# Patient Record
Sex: Female | Born: 1955 | Race: White | Smoking: Never smoker
Health system: Southern US, Community
[De-identification: ages and names within clinical notes are randomized; demographics above are authoritative.]

## PROBLEM LIST (undated history)

## (undated) DIAGNOSIS — E079 Disorder of thyroid, unspecified: Secondary | ICD-10-CM

## (undated) DIAGNOSIS — E785 Hyperlipidemia, unspecified: Secondary | ICD-10-CM

## (undated) DIAGNOSIS — F32A Depression, unspecified: Secondary | ICD-10-CM

## (undated) HISTORY — DX: Hyperlipidemia, unspecified: E78.5

## (undated) HISTORY — DX: Depression, unspecified: F32.A

## (undated) HISTORY — PX: AUGMENTATION MAMMAPLASTY: SUR837

## (undated) HISTORY — DX: Disorder of thyroid, unspecified: E07.9

---

## 2017-10-13 DIAGNOSIS — F419 Anxiety disorder, unspecified: Secondary | ICD-10-CM | POA: Insufficient documentation

## 2017-10-13 DIAGNOSIS — Z79899 Other long term (current) drug therapy: Secondary | ICD-10-CM | POA: Insufficient documentation

## 2017-10-13 DIAGNOSIS — F200 Paranoid schizophrenia: Secondary | ICD-10-CM | POA: Insufficient documentation

## 2017-11-30 DIAGNOSIS — L819 Disorder of pigmentation, unspecified: Secondary | ICD-10-CM | POA: Insufficient documentation

## 2017-11-30 DIAGNOSIS — E039 Hypothyroidism, unspecified: Secondary | ICD-10-CM | POA: Insufficient documentation

## 2018-02-22 DIAGNOSIS — R0982 Postnasal drip: Secondary | ICD-10-CM | POA: Insufficient documentation

## 2018-08-02 DIAGNOSIS — K117 Disturbances of salivary secretion: Secondary | ICD-10-CM | POA: Insufficient documentation

## 2018-09-14 DIAGNOSIS — F22 Delusional disorders: Secondary | ICD-10-CM | POA: Insufficient documentation

## 2021-08-05 DIAGNOSIS — Z79899 Other long term (current) drug therapy: Secondary | ICD-10-CM | POA: Diagnosis not present

## 2021-08-30 DIAGNOSIS — M25562 Pain in left knee: Secondary | ICD-10-CM | POA: Diagnosis not present

## 2021-09-07 DIAGNOSIS — Z79899 Other long term (current) drug therapy: Secondary | ICD-10-CM | POA: Diagnosis not present

## 2021-10-05 DIAGNOSIS — Z79899 Other long term (current) drug therapy: Secondary | ICD-10-CM | POA: Diagnosis not present

## 2021-11-04 DIAGNOSIS — Z79899 Other long term (current) drug therapy: Secondary | ICD-10-CM | POA: Diagnosis not present

## 2021-11-30 DIAGNOSIS — E7800 Pure hypercholesterolemia, unspecified: Secondary | ICD-10-CM | POA: Diagnosis not present

## 2021-11-30 DIAGNOSIS — E039 Hypothyroidism, unspecified: Secondary | ICD-10-CM | POA: Diagnosis not present

## 2021-11-30 DIAGNOSIS — Z79899 Other long term (current) drug therapy: Secondary | ICD-10-CM | POA: Diagnosis not present

## 2021-12-03 DIAGNOSIS — E039 Hypothyroidism, unspecified: Secondary | ICD-10-CM | POA: Diagnosis not present

## 2021-12-03 DIAGNOSIS — E7800 Pure hypercholesterolemia, unspecified: Secondary | ICD-10-CM | POA: Diagnosis not present

## 2021-12-03 DIAGNOSIS — Z79899 Other long term (current) drug therapy: Secondary | ICD-10-CM | POA: Diagnosis not present

## 2021-12-03 DIAGNOSIS — R109 Unspecified abdominal pain: Secondary | ICD-10-CM | POA: Diagnosis not present

## 2021-12-03 DIAGNOSIS — Z23 Encounter for immunization: Secondary | ICD-10-CM | POA: Diagnosis not present

## 2021-12-03 DIAGNOSIS — I700 Atherosclerosis of aorta: Secondary | ICD-10-CM | POA: Diagnosis not present

## 2021-12-03 DIAGNOSIS — D329 Benign neoplasm of meninges, unspecified: Secondary | ICD-10-CM | POA: Diagnosis not present

## 2021-12-03 DIAGNOSIS — Z0001 Encounter for general adult medical examination with abnormal findings: Secondary | ICD-10-CM | POA: Diagnosis not present

## 2021-12-07 ENCOUNTER — Ambulatory Visit
Admission: RE | Admit: 2021-12-07 | Discharge: 2021-12-07 | Disposition: A | Discharge status: Home / Self Care | Payer: Medicare Other | Source: Ambulatory Visit | Attending: Family Medicine | Admitting: Family Medicine

## 2021-12-07 ENCOUNTER — Other Ambulatory Visit: Payer: Self-pay | Admitting: Family Medicine

## 2021-12-07 ENCOUNTER — Other Ambulatory Visit: Payer: Self-pay

## 2021-12-07 DIAGNOSIS — M419 Scoliosis, unspecified: Secondary | ICD-10-CM | POA: Diagnosis not present

## 2021-12-07 DIAGNOSIS — R109 Unspecified abdominal pain: Secondary | ICD-10-CM

## 2021-12-07 DIAGNOSIS — Z9882 Breast implant status: Secondary | ICD-10-CM | POA: Diagnosis not present

## 2021-12-08 ENCOUNTER — Other Ambulatory Visit: Payer: Self-pay | Admitting: Family Medicine

## 2021-12-08 DIAGNOSIS — D329 Benign neoplasm of meninges, unspecified: Secondary | ICD-10-CM

## 2021-12-09 DIAGNOSIS — Z79899 Other long term (current) drug therapy: Secondary | ICD-10-CM | POA: Diagnosis not present

## 2021-12-17 ENCOUNTER — Other Ambulatory Visit: Payer: Self-pay | Admitting: Family Medicine

## 2021-12-17 DIAGNOSIS — K5900 Constipation, unspecified: Secondary | ICD-10-CM | POA: Diagnosis not present

## 2021-12-17 DIAGNOSIS — Z1231 Encounter for screening mammogram for malignant neoplasm of breast: Secondary | ICD-10-CM

## 2021-12-17 DIAGNOSIS — E2839 Other primary ovarian failure: Secondary | ICD-10-CM

## 2021-12-17 DIAGNOSIS — Z1211 Encounter for screening for malignant neoplasm of colon: Secondary | ICD-10-CM | POA: Diagnosis not present

## 2021-12-30 ENCOUNTER — Other Ambulatory Visit: Payer: Self-pay

## 2021-12-30 ENCOUNTER — Ambulatory Visit
Admission: RE | Admit: 2021-12-30 | Discharge: 2021-12-30 | Disposition: A | Discharge status: Home / Self Care | Payer: Medicare Other | Source: Ambulatory Visit | Attending: Family Medicine | Admitting: Family Medicine

## 2021-12-30 DIAGNOSIS — R42 Dizziness and giddiness: Secondary | ICD-10-CM | POA: Diagnosis not present

## 2021-12-30 DIAGNOSIS — D329 Benign neoplasm of meninges, unspecified: Secondary | ICD-10-CM

## 2022-01-04 DIAGNOSIS — Z79899 Other long term (current) drug therapy: Secondary | ICD-10-CM | POA: Diagnosis not present

## 2022-01-12 ENCOUNTER — Ambulatory Visit: Payer: Medicare Other

## 2022-02-01 ENCOUNTER — Other Ambulatory Visit: Payer: Self-pay | Admitting: Family Medicine

## 2022-02-01 ENCOUNTER — Ambulatory Visit
Admission: RE | Admit: 2022-02-01 | Discharge: 2022-02-01 | Disposition: A | Discharge status: Home / Self Care | Payer: Medicare Other | Source: Ambulatory Visit | Attending: Family Medicine | Admitting: Family Medicine

## 2022-02-01 ENCOUNTER — Other Ambulatory Visit: Payer: Self-pay

## 2022-02-01 DIAGNOSIS — Z1231 Encounter for screening mammogram for malignant neoplasm of breast: Secondary | ICD-10-CM | POA: Diagnosis not present

## 2022-02-03 ENCOUNTER — Other Ambulatory Visit: Payer: Self-pay | Admitting: Family Medicine

## 2022-02-03 DIAGNOSIS — Z79899 Other long term (current) drug therapy: Secondary | ICD-10-CM | POA: Diagnosis not present

## 2022-02-03 DIAGNOSIS — R928 Other abnormal and inconclusive findings on diagnostic imaging of breast: Secondary | ICD-10-CM

## 2022-02-15 ENCOUNTER — Other Ambulatory Visit: Payer: Medicare Other

## 2022-02-15 ENCOUNTER — Other Ambulatory Visit: Payer: Self-pay | Admitting: Family Medicine

## 2022-02-15 DIAGNOSIS — R928 Other abnormal and inconclusive findings on diagnostic imaging of breast: Secondary | ICD-10-CM

## 2022-03-08 ENCOUNTER — Ambulatory Visit
Admission: RE | Admit: 2022-03-08 | Discharge: 2022-03-08 | Disposition: A | Discharge status: Home / Self Care | Payer: Medicare Other | Source: Ambulatory Visit | Attending: Family Medicine | Admitting: Family Medicine

## 2022-03-08 ENCOUNTER — Other Ambulatory Visit: Payer: Self-pay | Admitting: Family Medicine

## 2022-03-08 ENCOUNTER — Ambulatory Visit: Payer: Medicare Other

## 2022-03-08 ENCOUNTER — Other Ambulatory Visit: Payer: Medicare Other

## 2022-03-08 DIAGNOSIS — R928 Other abnormal and inconclusive findings on diagnostic imaging of breast: Secondary | ICD-10-CM

## 2022-03-08 DIAGNOSIS — Z79899 Other long term (current) drug therapy: Secondary | ICD-10-CM | POA: Diagnosis not present

## 2022-03-08 DIAGNOSIS — N6489 Other specified disorders of breast: Secondary | ICD-10-CM

## 2022-03-08 DIAGNOSIS — R922 Inconclusive mammogram: Secondary | ICD-10-CM | POA: Diagnosis not present

## 2022-03-21 DIAGNOSIS — J029 Acute pharyngitis, unspecified: Secondary | ICD-10-CM | POA: Diagnosis not present

## 2022-03-21 DIAGNOSIS — B349 Viral infection, unspecified: Secondary | ICD-10-CM | POA: Diagnosis not present

## 2022-03-21 DIAGNOSIS — J40 Bronchitis, not specified as acute or chronic: Secondary | ICD-10-CM | POA: Diagnosis not present

## 2022-03-21 DIAGNOSIS — R059 Cough, unspecified: Secondary | ICD-10-CM | POA: Diagnosis not present

## 2022-03-21 DIAGNOSIS — U071 COVID-19: Secondary | ICD-10-CM | POA: Diagnosis not present

## 2022-03-21 DIAGNOSIS — R5383 Other fatigue: Secondary | ICD-10-CM | POA: Diagnosis not present

## 2022-03-26 DIAGNOSIS — Z20822 Contact with and (suspected) exposure to covid-19: Secondary | ICD-10-CM | POA: Diagnosis not present

## 2022-04-05 DIAGNOSIS — Z79899 Other long term (current) drug therapy: Secondary | ICD-10-CM | POA: Diagnosis not present

## 2022-05-05 ENCOUNTER — Ambulatory Visit
Admission: RE | Admit: 2022-05-05 | Discharge: 2022-05-05 | Disposition: A | Discharge status: Home / Self Care | Payer: Medicare Other | Source: Ambulatory Visit | Attending: Family Medicine | Admitting: Family Medicine

## 2022-05-05 DIAGNOSIS — M85852 Other specified disorders of bone density and structure, left thigh: Secondary | ICD-10-CM | POA: Diagnosis not present

## 2022-05-05 DIAGNOSIS — Z780 Asymptomatic menopausal state: Secondary | ICD-10-CM | POA: Diagnosis not present

## 2022-05-05 DIAGNOSIS — E2839 Other primary ovarian failure: Secondary | ICD-10-CM

## 2022-05-09 DIAGNOSIS — Z79899 Other long term (current) drug therapy: Secondary | ICD-10-CM | POA: Diagnosis not present

## 2022-05-31 DIAGNOSIS — Z79899 Other long term (current) drug therapy: Secondary | ICD-10-CM | POA: Diagnosis not present

## 2022-05-31 DIAGNOSIS — D329 Benign neoplasm of meninges, unspecified: Secondary | ICD-10-CM | POA: Diagnosis not present

## 2022-05-31 DIAGNOSIS — E7800 Pure hypercholesterolemia, unspecified: Secondary | ICD-10-CM | POA: Diagnosis not present

## 2022-05-31 DIAGNOSIS — I700 Atherosclerosis of aorta: Secondary | ICD-10-CM | POA: Diagnosis not present

## 2022-05-31 DIAGNOSIS — K5900 Constipation, unspecified: Secondary | ICD-10-CM | POA: Diagnosis not present

## 2022-05-31 DIAGNOSIS — E039 Hypothyroidism, unspecified: Secondary | ICD-10-CM | POA: Diagnosis not present

## 2022-06-14 DIAGNOSIS — Z79899 Other long term (current) drug therapy: Secondary | ICD-10-CM | POA: Diagnosis not present

## 2022-07-13 DIAGNOSIS — Z79899 Other long term (current) drug therapy: Secondary | ICD-10-CM | POA: Diagnosis not present

## 2022-07-26 ENCOUNTER — Ambulatory Visit (INDEPENDENT_AMBULATORY_CARE_PROVIDER_SITE_OTHER): Payer: Medicare Other | Admitting: Podiatry

## 2022-07-26 ENCOUNTER — Ambulatory Visit: Payer: Medicare Other

## 2022-07-26 DIAGNOSIS — B351 Tinea unguium: Secondary | ICD-10-CM

## 2022-07-26 DIAGNOSIS — M79675 Pain in left toe(s): Secondary | ICD-10-CM

## 2022-07-26 DIAGNOSIS — M79674 Pain in right toe(s): Secondary | ICD-10-CM

## 2022-07-26 DIAGNOSIS — S99922A Unspecified injury of left foot, initial encounter: Secondary | ICD-10-CM

## 2022-07-27 ENCOUNTER — Encounter: Payer: Self-pay | Admitting: Podiatry

## 2022-07-27 NOTE — Progress Notes (Signed)
  Subjective:  Patient ID: Katelyn Ryan, female    DOB: June 23, 1956,  MRN: [id number]  Chief Complaint  Patient presents with   Nail Problem    Left first and second toenails are thick     66 y.o. female presents with the above complaint. History confirmed with patient.   Objective:  Physical Exam: warm, good capillary refill, no trophic changes or ulcerative lesions, normal DP and PT pulses, normal sensory exam, and thickened dystrophic brown discolored nails, the worst of which over the left hallux and second toenail.  Assessment:   1. Pain due to onychomycosis of toenails of both feet      Plan:  Patient was evaluated and treated and all questions answered.  Discussed the etiology and treatment options for the condition in detail with the patient. Educated patient on the topical and oral treatment options for mycotic nails. Recommended debridement of the nails today. Sharp and mechanical debridement performed of all painful and mycotic nails today. Nails debrided in length and thickness using a nail nipper to level of comfort. Discussed treatment options including appropriate shoe gear. Follow up as needed for painful nails.   Return in about 3 months (around 10/26/2022) for RFC .

## 2022-08-04 DIAGNOSIS — Z79899 Other long term (current) drug therapy: Secondary | ICD-10-CM | POA: Diagnosis not present

## 2022-08-19 DIAGNOSIS — Z1283 Encounter for screening for malignant neoplasm of skin: Secondary | ICD-10-CM | POA: Diagnosis not present

## 2022-08-19 DIAGNOSIS — D225 Melanocytic nevi of trunk: Secondary | ICD-10-CM | POA: Diagnosis not present

## 2022-08-19 DIAGNOSIS — D485 Neoplasm of uncertain behavior of skin: Secondary | ICD-10-CM | POA: Diagnosis not present

## 2022-09-06 DIAGNOSIS — Z79899 Other long term (current) drug therapy: Secondary | ICD-10-CM | POA: Diagnosis not present

## 2022-09-08 ENCOUNTER — Ambulatory Visit
Admission: RE | Admit: 2022-09-08 | Discharge: 2022-09-08 | Disposition: A | Discharge status: Home / Self Care | Payer: Medicare Other | Source: Ambulatory Visit | Attending: Family Medicine | Admitting: Family Medicine

## 2022-09-08 DIAGNOSIS — R922 Inconclusive mammogram: Secondary | ICD-10-CM | POA: Diagnosis not present

## 2022-09-08 DIAGNOSIS — N6489 Other specified disorders of breast: Secondary | ICD-10-CM

## 2022-10-10 DIAGNOSIS — Z79899 Other long term (current) drug therapy: Secondary | ICD-10-CM | POA: Diagnosis not present

## 2022-10-26 ENCOUNTER — Encounter: Payer: Self-pay | Admitting: Podiatry

## 2022-10-26 ENCOUNTER — Ambulatory Visit (INDEPENDENT_AMBULATORY_CARE_PROVIDER_SITE_OTHER): Payer: Medicare Other | Admitting: Podiatry

## 2022-10-26 DIAGNOSIS — M79675 Pain in left toe(s): Secondary | ICD-10-CM

## 2022-10-26 DIAGNOSIS — B351 Tinea unguium: Secondary | ICD-10-CM

## 2022-10-26 DIAGNOSIS — M79674 Pain in right toe(s): Secondary | ICD-10-CM

## 2022-10-26 NOTE — Progress Notes (Signed)
This patient presents to the office with chief complaint of long thick painful nails.  Patient says the nails are painful walking and wearing shoes.  This patient is unable to self treat.  This patient is unable to trim her nails since she is unable to reach her nails.  She presents to the office for preventative foot care services.  General Appearance  Alert, conversant and in no acute stress.  Vascular  Dorsalis pedis and posterior tibial  pulses are palpable  bilaterally.  Capillary return is within normal limits  bilaterally. Temperature is within normal limits  bilaterally.  Neurologic  Senn-Weinstein monofilament wire test within normal limits  bilaterally. Muscle power within normal limits bilaterally.  Nails Thick disfigured discolored nails with subungual debris  from hallux to fifth toes bilaterally. No evidence of bacterial infection or drainage bilaterally.  Orthopedic  No limitations of motion  feet .  No crepitus or effusions noted.  No bony pathology or digital deformities noted.  Skin  normotropic skin with no porokeratosis noted bilaterally.  No signs of infections or ulcers noted.     Onychomycosis  Nails  B/L.  Pain in right toes  Pain in left toes  Debridement of nails both feet followed trimming the nails with dremel tool.  Patient is wearing nail polish so minimal dremel tool usage.  RTC 3 months.   Gardiner Barefoot DPM

## 2022-11-07 DIAGNOSIS — Z79899 Other long term (current) drug therapy: Secondary | ICD-10-CM | POA: Diagnosis not present

## 2022-11-24 DIAGNOSIS — Z79899 Other long term (current) drug therapy: Secondary | ICD-10-CM | POA: Diagnosis not present

## 2022-11-24 DIAGNOSIS — E7800 Pure hypercholesterolemia, unspecified: Secondary | ICD-10-CM | POA: Diagnosis not present

## 2022-11-24 DIAGNOSIS — E039 Hypothyroidism, unspecified: Secondary | ICD-10-CM | POA: Diagnosis not present

## 2022-11-24 DIAGNOSIS — Z0001 Encounter for general adult medical examination with abnormal findings: Secondary | ICD-10-CM | POA: Diagnosis not present

## 2022-11-24 DIAGNOSIS — I700 Atherosclerosis of aorta: Secondary | ICD-10-CM | POA: Diagnosis not present

## 2022-12-08 DIAGNOSIS — Z79899 Other long term (current) drug therapy: Secondary | ICD-10-CM | POA: Diagnosis not present

## 2022-12-19 ENCOUNTER — Emergency Department (HOSPITAL_COMMUNITY): Payer: Medicare Other

## 2022-12-19 ENCOUNTER — Other Ambulatory Visit: Payer: Self-pay

## 2022-12-19 ENCOUNTER — Emergency Department (HOSPITAL_COMMUNITY)
Admission: EM | Admit: 2022-12-19 | Discharge: 2022-12-19 | Disposition: A | Discharge status: Home / Self Care | Payer: Medicare Other | Attending: Emergency Medicine | Admitting: Emergency Medicine

## 2022-12-19 ENCOUNTER — Encounter (HOSPITAL_COMMUNITY): Payer: Self-pay

## 2022-12-19 DIAGNOSIS — R531 Weakness: Secondary | ICD-10-CM | POA: Diagnosis not present

## 2022-12-19 DIAGNOSIS — M6281 Muscle weakness (generalized): Secondary | ICD-10-CM | POA: Insufficient documentation

## 2022-12-19 DIAGNOSIS — R059 Cough, unspecified: Secondary | ICD-10-CM | POA: Diagnosis not present

## 2022-12-19 DIAGNOSIS — I517 Cardiomegaly: Secondary | ICD-10-CM | POA: Insufficient documentation

## 2022-12-19 DIAGNOSIS — R000 Tachycardia, unspecified: Secondary | ICD-10-CM | POA: Diagnosis not present

## 2022-12-19 DIAGNOSIS — W19XXXA Unspecified fall, initial encounter: Secondary | ICD-10-CM | POA: Diagnosis not present

## 2022-12-19 DIAGNOSIS — Z1152 Encounter for screening for COVID-19: Secondary | ICD-10-CM | POA: Insufficient documentation

## 2022-12-19 DIAGNOSIS — D329 Benign neoplasm of meninges, unspecified: Secondary | ICD-10-CM | POA: Diagnosis not present

## 2022-12-19 DIAGNOSIS — Z743 Need for continuous supervision: Secondary | ICD-10-CM | POA: Diagnosis not present

## 2022-12-19 DIAGNOSIS — R404 Transient alteration of awareness: Secondary | ICD-10-CM | POA: Diagnosis not present

## 2022-12-19 DIAGNOSIS — R0989 Other specified symptoms and signs involving the circulatory and respiratory systems: Secondary | ICD-10-CM | POA: Diagnosis not present

## 2022-12-19 DIAGNOSIS — R55 Syncope and collapse: Secondary | ICD-10-CM | POA: Diagnosis not present

## 2022-12-19 LAB — RESP PANEL BY RT-PCR (RSV, FLU A&B, COVID)  RVPGX2
Influenza A by PCR: NEGATIVE
Influenza B by PCR: NEGATIVE
Resp Syncytial Virus by PCR: NEGATIVE
SARS Coronavirus 2 by RT PCR: NEGATIVE

## 2022-12-19 LAB — CBC WITH DIFFERENTIAL/PLATELET
Abs Immature Granulocytes: 0.03 10*3/uL (ref 0.00–0.07)
Basophils Absolute: 0 10*3/uL (ref 0.0–0.1)
Basophils Relative: 0 %
Eosinophils Absolute: 0 10*3/uL (ref 0.0–0.5)
Eosinophils Relative: 0 %
HCT: 41.7 % (ref 36.0–46.0)
Hemoglobin: 13.2 g/dL (ref 12.0–15.0)
Immature Granulocytes: 0 %
Lymphocytes Relative: 8 %
Lymphs Abs: 0.7 10*3/uL (ref 0.7–4.0)
MCH: 30.1 pg (ref 26.0–34.0)
MCHC: 31.7 g/dL (ref 30.0–36.0)
MCV: 95 fL (ref 80.0–100.0)
Monocytes Absolute: 0.9 10*3/uL (ref 0.1–1.0)
Monocytes Relative: 10 %
Neutro Abs: 7 10*3/uL (ref 1.7–7.7)
Neutrophils Relative %: 82 %
Platelets: 187 10*3/uL (ref 150–400)
RBC: 4.39 MIL/uL (ref 3.87–5.11)
RDW: 13.4 % (ref 11.5–15.5)
WBC: 8.6 10*3/uL (ref 4.0–10.5)
nRBC: 0 % (ref 0.0–0.2)

## 2022-12-19 LAB — COMPREHENSIVE METABOLIC PANEL
ALT: 20 U/L (ref 0–44)
AST: 28 U/L (ref 15–41)
Albumin: 4 g/dL (ref 3.5–5.0)
Alkaline Phosphatase: 79 U/L (ref 38–126)
Anion gap: 11 (ref 5–15)
BUN: 12 mg/dL (ref 8–23)
CO2: 28 mmol/L (ref 22–32)
Calcium: 9.1 mg/dL (ref 8.9–10.3)
Chloride: 101 mmol/L (ref 98–111)
Creatinine, Ser: 0.8 mg/dL (ref 0.44–1.00)
GFR, Estimated: >60 mL/min (ref >60)
Glucose, Bld: 99 mg/dL (ref 70–99)
Potassium: 3.7 mmol/L (ref 3.5–5.1)
Sodium: 140 mmol/L (ref 135–145)
Total Bilirubin: 0.4 mg/dL (ref 0.3–1.2)
Total Protein: 7 g/dL (ref 6.5–8.1)

## 2022-12-19 LAB — COOXEMETRY PANEL
Carboxyhemoglobin: 0.8 % (ref 0.5–1.5)
Methemoglobin: <0.7 % (ref 0.0–1.5)
O2 Saturation: 61.7 %
Total hemoglobin: 13.4 g/dL (ref 12.0–16.0)

## 2022-12-19 LAB — AMMONIA: Ammonia: 11 umol/L (ref 9–35)

## 2022-12-19 MED ORDER — ACETAMINOPHEN 325 MG PO TABS
650.0000 mg | ORAL_TABLET | Freq: Once | ORAL | Status: AC
  Administered 2022-12-19: 650 mg via ORAL
  Filled 2022-12-19: qty 2

## 2022-12-19 NOTE — ED Triage Notes (Addendum)
Patient BIB [facility] from the [facility] ([place]) for generalized weakness and bilateral leg shakiness. Patient said at night her legs cramp.

## 2022-12-19 NOTE — ED Notes (Signed)
Pt's visitor to nurses station asking if we will be giving the pt her regular 20:00 medications. Advised for her to speak with EDP when they come in to see pt.

## 2022-12-19 NOTE — ED Notes (Signed)
Pt attempted to provide urine sample unsuccessfully. Pt is ambulatory to restroom w/out assistance. Will attempt urine collection again at a later time.

## 2022-12-19 NOTE — ED Provider Triage Note (Signed)
Emergency Medicine Provider Triage Evaluation Note  Katelyn Ryan , a 67 y.o. female  was evaluated in triage.  Pt complains of weaknessx2 days. From group home, staff states she was off balance today and requiring 2+ assist. Normal independent ADLs. No chest pain or SOB  Review of Systems  Positive: weakness Negative: SOB  Physical Exam  BP 131/78 (BP Location: Left Arm)   Pulse (!) 115   Temp 100 F (37.8 C) (Oral)   Resp 17   Ht '5\' 4"'$  (1.626 m)   Wt 64 kg   SpO2 96%   BMI 24.20 kg/m  Gen:   Awake, no distress   Resp:  Normal effort  MSK:   Moves extremities without difficulty    Medical Decision Making  Medically screening exam initiated at 12:17 PM.  Appropriate orders placed.  Katelyn Ryan was informed that the remainder of the evaluation will be completed by another provider, this initial triage assessment does not replace that evaluation, and the importance of remaining in the ED until their evaluation is complete.    Osvaldo Shipper, [place] 12/19/22 1218

## 2022-12-19 NOTE — ED Provider Notes (Signed)
[facility] DEPT Provider Note   CSN: [id number] Arrival date & time: 12/19/22  1128     History  Chief Complaint  Patient presents with   Weakness    Katelyn Ryan is a 67 y.o. female.  67 year old female presents from group home with weakness x 2 days.  Patient has had trouble ambulating as of late.  Has had cough congestion.  Denies any vomiting or diarrhea.  Some scratchy throat.  Denies any urinary symptoms.  No abdominal or chest discomfort.       Home Medications Prior to Admission medications   Not on File      Allergies    Patient has no known allergies.    Review of Systems   Review of Systems  All other systems reviewed and are negative.   Physical Exam Updated Vital Signs BP 131/78 (BP Location: Left Arm)   Pulse (!) 115   Temp 100 F (37.8 C) (Oral)   Resp 17   Ht 1.626 m ('5\' 4"'$ )   Wt 64 kg   SpO2 96%   BMI 24.20 kg/m  Physical Exam Vitals and nursing note reviewed.  Constitutional:      General: She is not in acute distress.    Appearance: Normal appearance. She is well-developed. She is not toxic-appearing.  HENT:     Head: Normocephalic and atraumatic.  Eyes:     General: Lids are normal.     Conjunctiva/sclera: Conjunctivae normal.     Pupils: Pupils are equal, round, and reactive to light.  Neck:     Thyroid: No thyroid mass.     Trachea: No tracheal deviation.  Cardiovascular:     Rate and Rhythm: Normal rate and regular rhythm.     Heart sounds: Normal heart sounds. No murmur heard.    No gallop.  Pulmonary:     Effort: Pulmonary effort is normal. No respiratory distress.     Breath sounds: Normal breath sounds. No stridor. No decreased breath sounds, wheezing, rhonchi or rales.  Abdominal:     General: There is no distension.     Palpations: Abdomen is soft.     Tenderness: There is no abdominal tenderness. There is no rebound.  Musculoskeletal:        General: No tenderness. Normal range of  motion.     Cervical back: Normal range of motion and neck supple.     Right knee: Normal range of motion. No tenderness.     Comments: Patient able to ambulate without difficulty.  Skin:    General: Skin is warm and dry.     Findings: No abrasion or rash.  Neurological:     Mental Status: She is alert and oriented to person, place, and time. Mental status is at baseline.     GCS: GCS eye subscore is 4. GCS verbal subscore is 5. GCS motor subscore is 6.     Cranial Nerves: No cranial nerve deficit.     Sensory: No sensory deficit.     Motor: Motor function is intact.  Psychiatric:        Attention and Perception: Attention normal.        Speech: Speech normal.        Behavior: Behavior normal.     ED Results / Procedures / Treatments   Labs (all labs ordered are listed, but only abnormal results are displayed) Labs Reviewed  RESP PANEL BY RT-PCR (RSV, FLU A&B, COVID)  [id number]  COMPREHENSIVE METABOLIC PANEL  CBC WITH DIFFERENTIAL/PLATELET  AMMONIA  COOXEMETRY PANEL  URINALYSIS, ROUTINE W REFLEX MICROSCOPIC    EKG EKG Interpretation  Date/Time:  Monday December 19 2022 12:52:05 EST Ventricular Rate:  110 PR Interval:  140 QRS Duration: 88 QT Interval:  314 QTC Calculation: 425 R Axis:   23 Text Interpretation: Sinus tachycardia Left atrial enlargement Low voltage, precordial leads RSR' in V1 or V2, probably normal variant Borderline T wave abnormalities Confirmed by Lacretia Leigh ([id number]) on 12/19/2022 7:16:26 PM  Radiology CT Head Wo Contrast  Result Date: 12/19/2022 CLINICAL DATA:  Syncope/presyncope, cerebrovascular cause suspected EXAM: CT HEAD WITHOUT CONTRAST TECHNIQUE: Contiguous axial images were obtained from the base of the skull through the vertex without intravenous contrast. RADIATION DOSE REDUCTION: This exam was performed according to the departmental dose-optimization program which includes automated exposure control, adjustment of the mA and/or kV according  to patient size and/or use of iterative reconstruction technique. COMPARISON:  MRI 12/30/2021. FINDINGS: Brain: No evidence of acute infarction, hemorrhage, hydrocephalus, or extra-axial collection. 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 Vascular: No hyperdense vessel. Skull: Normal. Negative for fracture or focal lesion. Sinuses/Orbits: No acute finding. Other: No mastoid effusions IMPRESSION: 1. No evidence of acute intracranial abnormality. 2. 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 Electronically Signed   By: Margaretha Sheffield M.D.   On: 12/19/2022 13:43    Procedures Procedures    Medications Ordered in ED Medications  acetaminophen (TYLENOL) tablet 650 mg (has no administration in time range)    ED Course/ Medical Decision Making/ A&P                             Medical Decision Making Amount and/or Complexity of Data Reviewed Radiology: ordered.  Risk OTC drugs.   The patient presented with diffuse weakness x 2 days.  Was noted to have low-grade temperature here.  COVID and flu test were negative.  Does not appear to be septic.  Has history of angioma of the brain and head CT today but was performed to rule out serious intracranial pathology.  Per my review interpretation no acute findings noted.  No leukocytosis noted on CBC.  Chest x-ray per interpretation showed no acute findings.  Suspect viral illness.  Will discharge back to facility.  She is here with her power of attorney who agrees with this        Final Clinical Impression(s) / ED Diagnoses Final diagnoses:  None    Rx / DC Orders ED Discharge Orders     None         Lacretia Leigh, MD 12/19/22 2040

## 2023-01-03 IMAGING — CR DG ABDOMEN 2V
2 series · 2 of 2 positions shown · non-contrast
Comparison: Abdominal x-ray 12/11/2018.

CLINICAL DATA: Abdominal discomfort, question constipation.

EXAM:
ABDOMEN - 2 VIEW

[w abdomen upright]
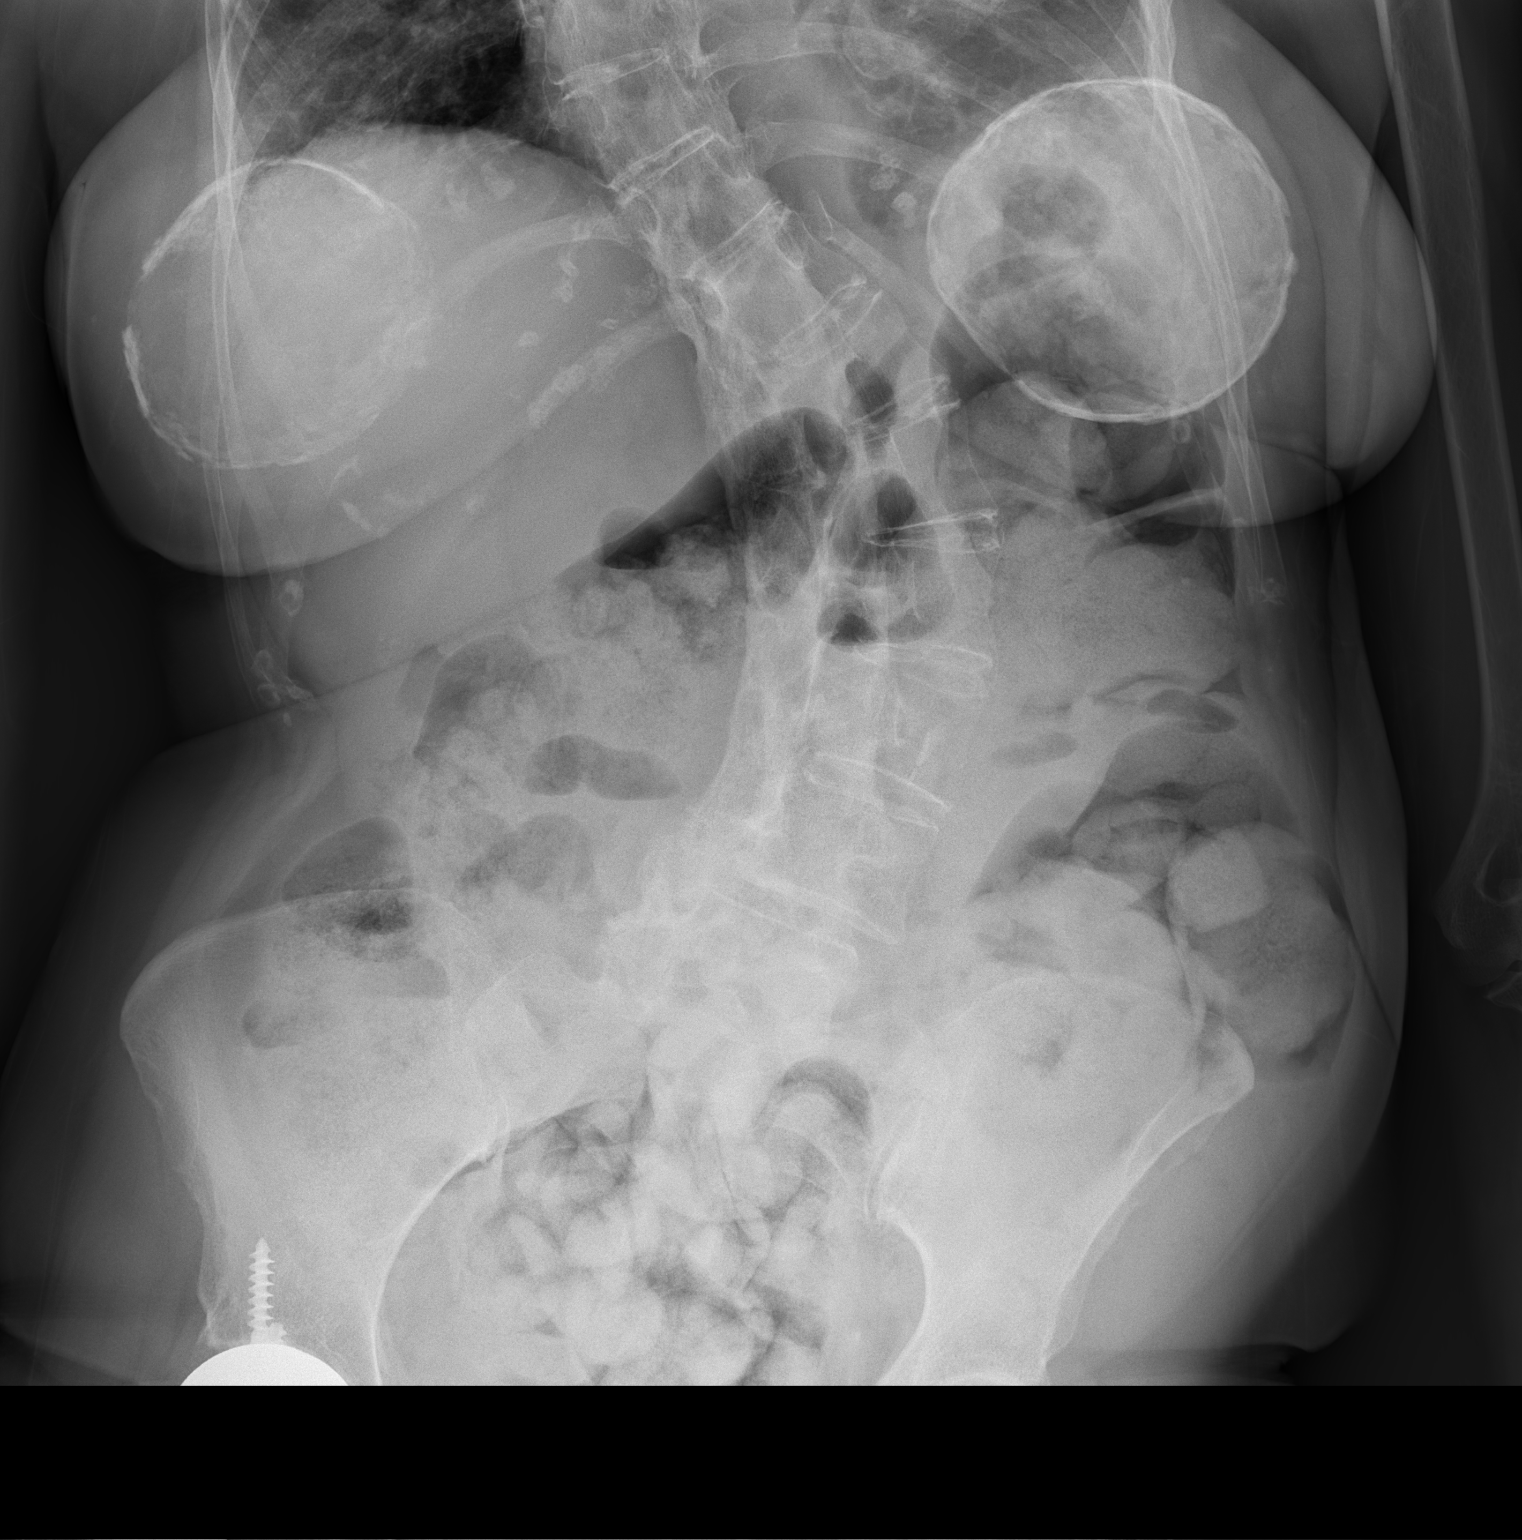

[t abdomen supine]
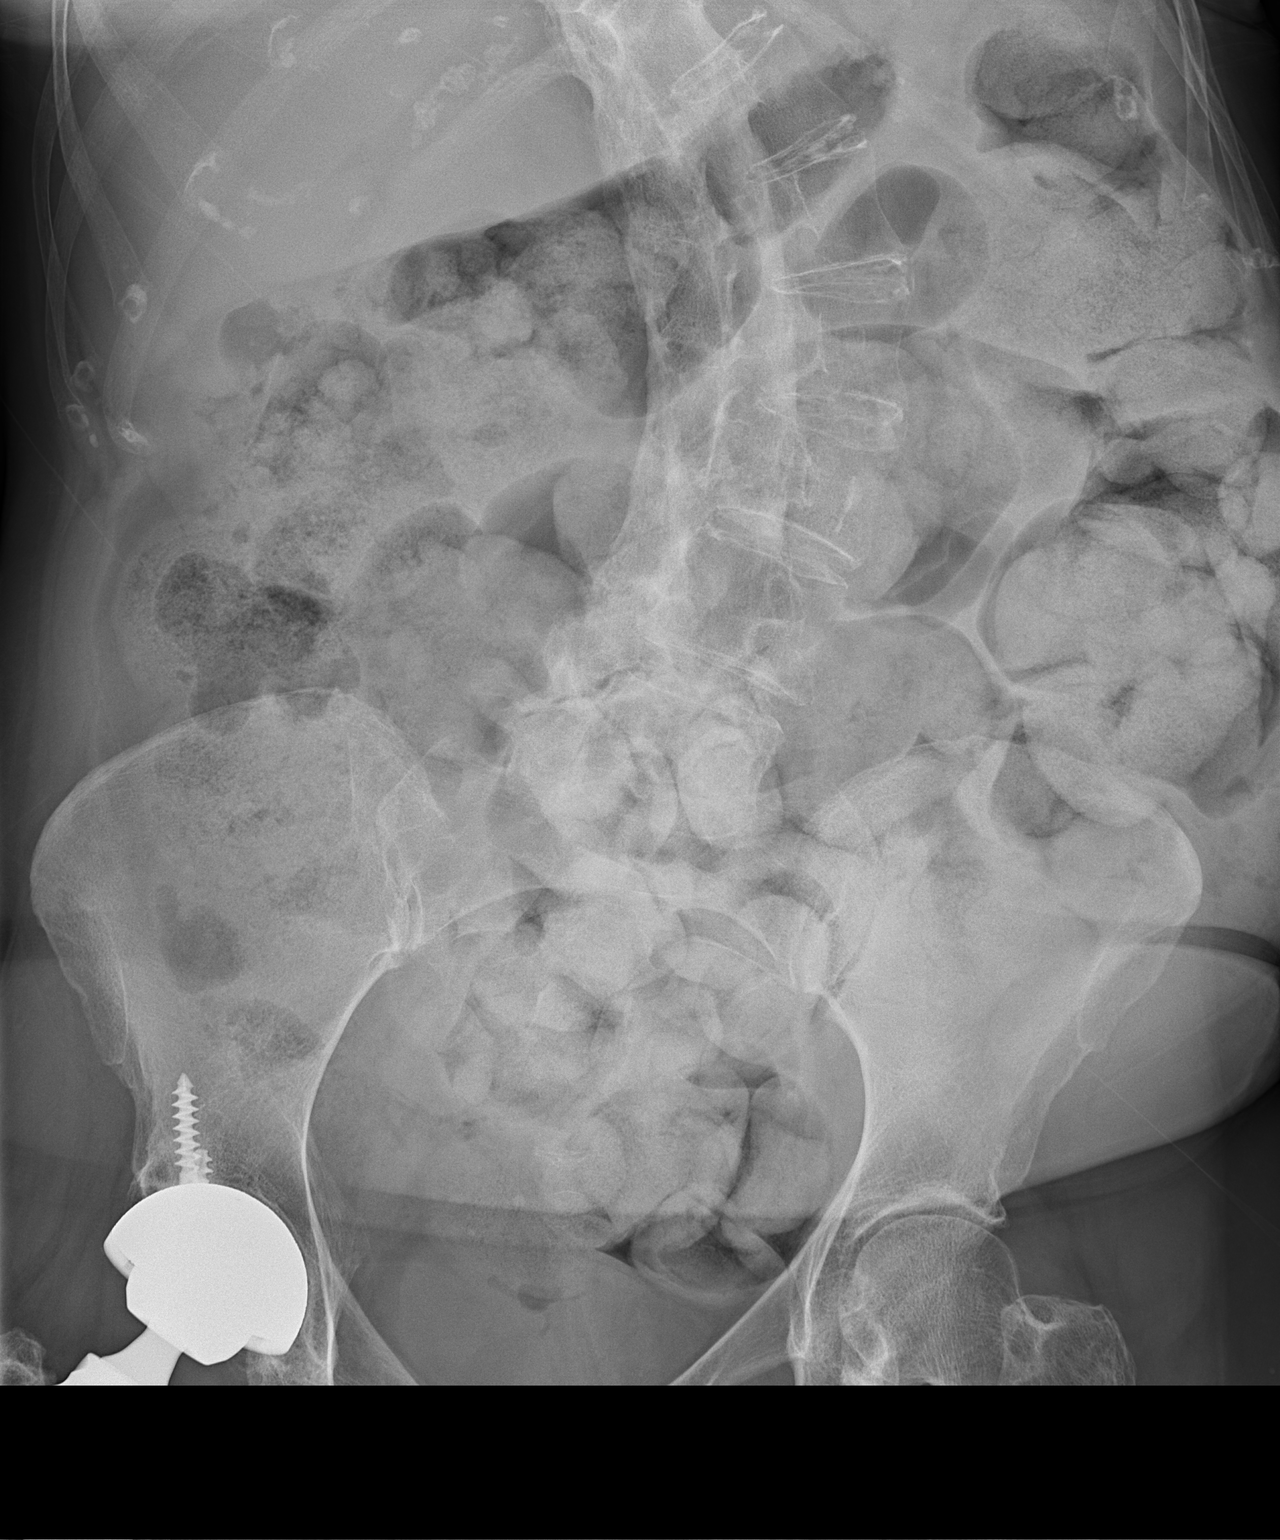

[2 of 2 positions shown; findings below may reference images not displayed]

FINDINGS: Bowel-gas pattern is nonobstructive. There is a large amount of
stool throughout the entire colon and. No suspicious calcifications
are identified. There is levoconvex scoliosis of the lumbar spine.
Peripherally calcified breast implants are present. Right hip
arthroplasty is present.
IMPRESSION: 1. Nonobstructive bowel gas pattern.
2. Large amount of stool throughout the colon and within the rectum.

## 2023-01-06 DIAGNOSIS — Z79899 Other long term (current) drug therapy: Secondary | ICD-10-CM | POA: Diagnosis not present

## 2023-01-26 DIAGNOSIS — R1900 Intra-abdominal and pelvic swelling, mass and lump, unspecified site: Secondary | ICD-10-CM | POA: Diagnosis not present

## 2023-01-30 ENCOUNTER — Ambulatory Visit: Payer: Medicare Other | Admitting: Podiatry

## 2023-02-13 DIAGNOSIS — Z79899 Other long term (current) drug therapy: Secondary | ICD-10-CM | POA: Diagnosis not present

## 2023-02-16 DIAGNOSIS — Z0279 Encounter for issue of other medical certificate: Secondary | ICD-10-CM | POA: Diagnosis not present

## 2023-02-27 ENCOUNTER — Other Ambulatory Visit: Payer: Self-pay | Admitting: Family Medicine

## 2023-02-27 DIAGNOSIS — N6489 Other specified disorders of breast: Secondary | ICD-10-CM

## 2023-03-01 DIAGNOSIS — Z1211 Encounter for screening for malignant neoplasm of colon: Secondary | ICD-10-CM | POA: Diagnosis not present

## 2023-03-06 ENCOUNTER — Ambulatory Visit: Payer: Medicare Other | Admitting: Podiatry

## 2023-03-07 DIAGNOSIS — Z79899 Other long term (current) drug therapy: Secondary | ICD-10-CM | POA: Diagnosis not present

## 2023-03-13 ENCOUNTER — Ambulatory Visit (INDEPENDENT_AMBULATORY_CARE_PROVIDER_SITE_OTHER): Payer: Medicare Other | Admitting: Podiatry

## 2023-03-13 ENCOUNTER — Encounter: Payer: Self-pay | Admitting: Podiatry

## 2023-03-13 DIAGNOSIS — B351 Tinea unguium: Secondary | ICD-10-CM | POA: Diagnosis not present

## 2023-03-13 DIAGNOSIS — M79674 Pain in right toe(s): Secondary | ICD-10-CM | POA: Diagnosis not present

## 2023-03-13 DIAGNOSIS — M79675 Pain in left toe(s): Secondary | ICD-10-CM | POA: Diagnosis not present

## 2023-03-13 NOTE — Progress Notes (Signed)
This patient presents to the office with chief complaint of long thick painful nails.  Patient says the nails are painful walking and wearing shoes.  This patient is unable to self treat.  This patient is unable to trim her nails since she is unable to reach her nails.  She presents to the office for preventative foot care services.  General Appearance  Alert, conversant and in no acute stress.  Vascular  Dorsalis pedis and posterior tibial  pulses are palpable  bilaterally.  Capillary return is within normal limits  bilaterally. Temperature is within normal limits  bilaterally.  Neurologic  Senn-Weinstein monofilament wire test within normal limits  bilaterally. Muscle power within normal limits bilaterally.  Nails Thick disfigured discolored nails with subungual debris  from hallux to fifth toes bilaterally. No evidence of bacterial infection or drainage bilaterally.  Orthopedic  No limitations of motion  feet .  No crepitus or effusions noted.  No bony pathology or digital deformities noted.  Skin  normotropic skin with no porokeratosis noted bilaterally.  No signs of infections or ulcers noted.     Onychomycosis  Nails  B/L.  Pain in right toes  Pain in left toes  Debridement of nails both feet followed trimming the nails with dremel tool.  Patient is wearing nail polish so minimal dremel tool usage.  RTC 4 months.   Helane Gunther DPM

## 2023-04-04 IMAGING — MG MM  DIGITAL DIAGNOSTIC BREAST BILAT IMPLANT W/ TOMO W/ CAD
8 of 12 series · 8 of 32 positions shown · non-contrast
Comparison: Previous exam(s).

CLINICAL DATA: 65-year-old female presenting as a recall from
baseline screening for possible right breast asymmetries and
possible left breast mass.

EXAM:
DIGITAL DIAGNOSTIC BILATERAL MAMMOGRAM WITH IMPLANTS, CAD AND
TOMOSYNTHESIS; ULTRASOUND RIGHT BREAST LIMITED
TECHNIQUE: Bilateral digital diagnostic mammography and breast tomosynthesis
was performed. The images were evaluated with computer-aided
detection. Standard and/or implant displaced views were performed.;
Targeted ultrasound examination of the right breast was performed

[R ML]
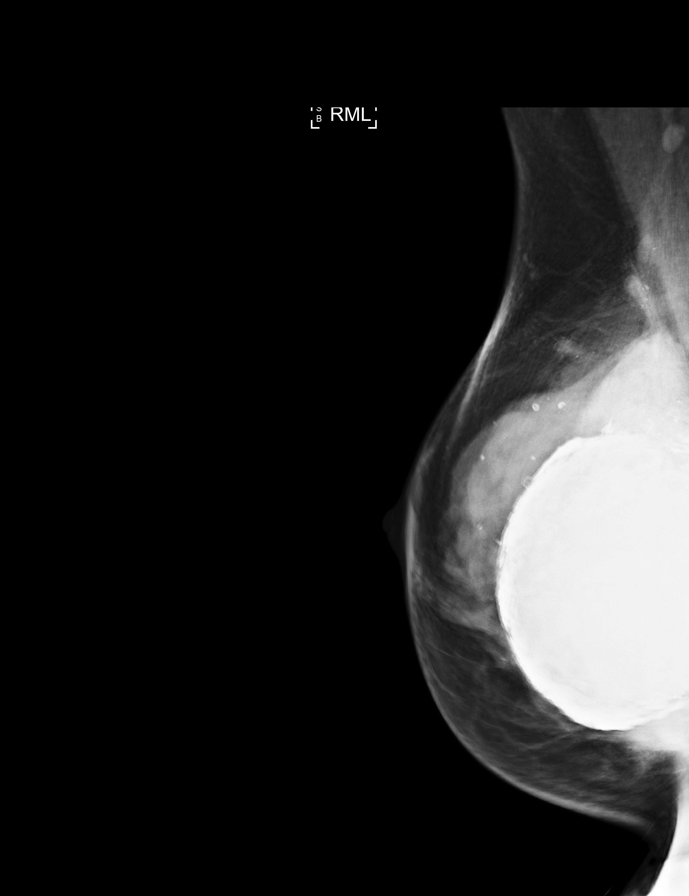

[R MLO]
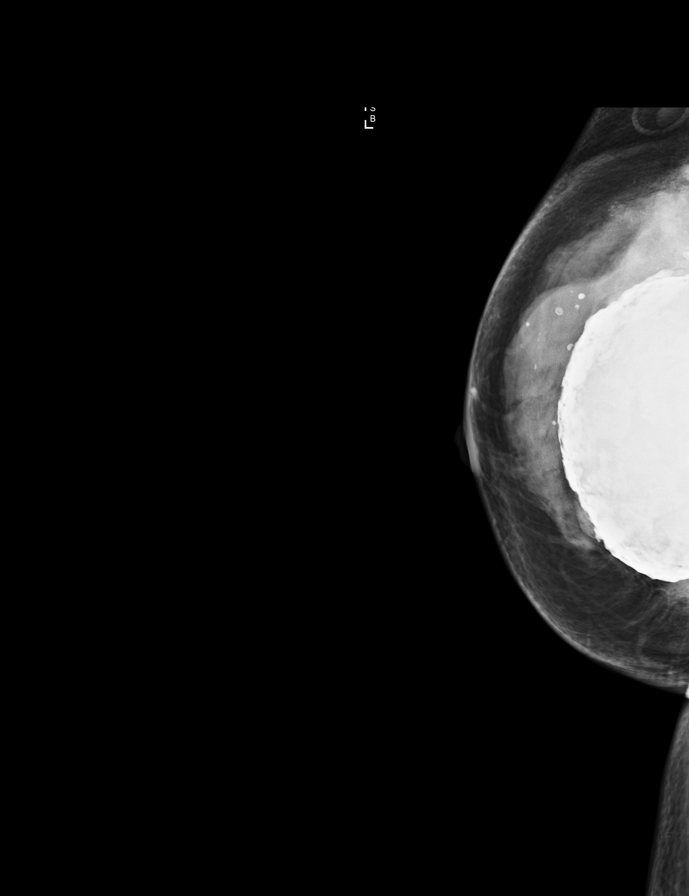

[R ML synth-2D]
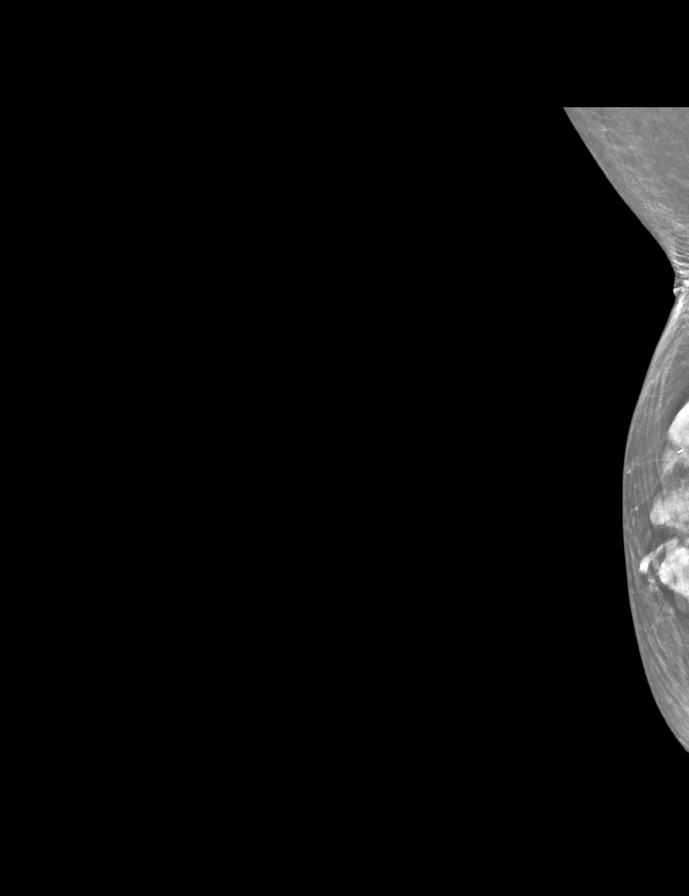

[L MLO synth-2D (1 of 2)]
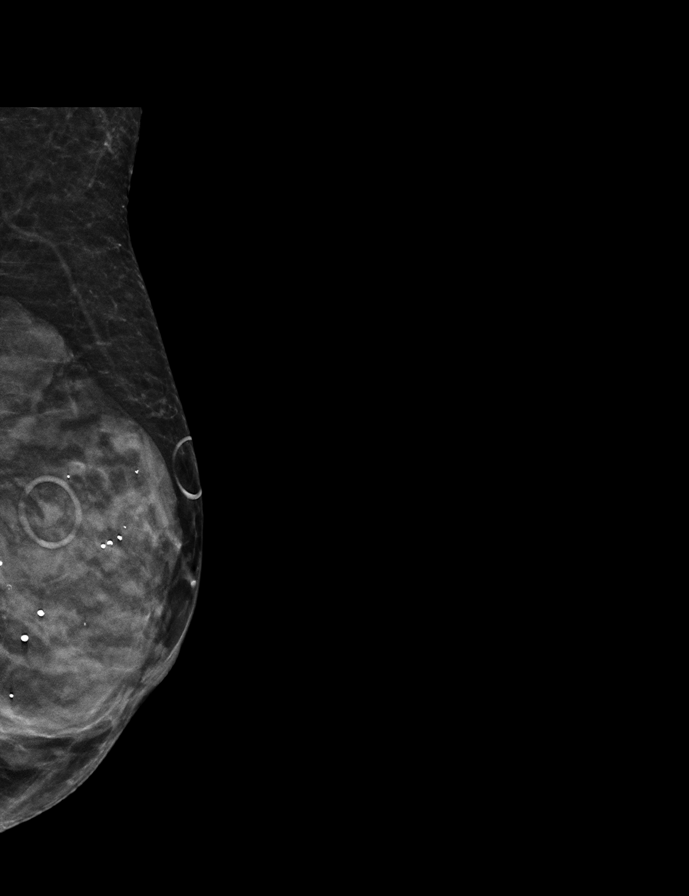

[L ML synth-2D]
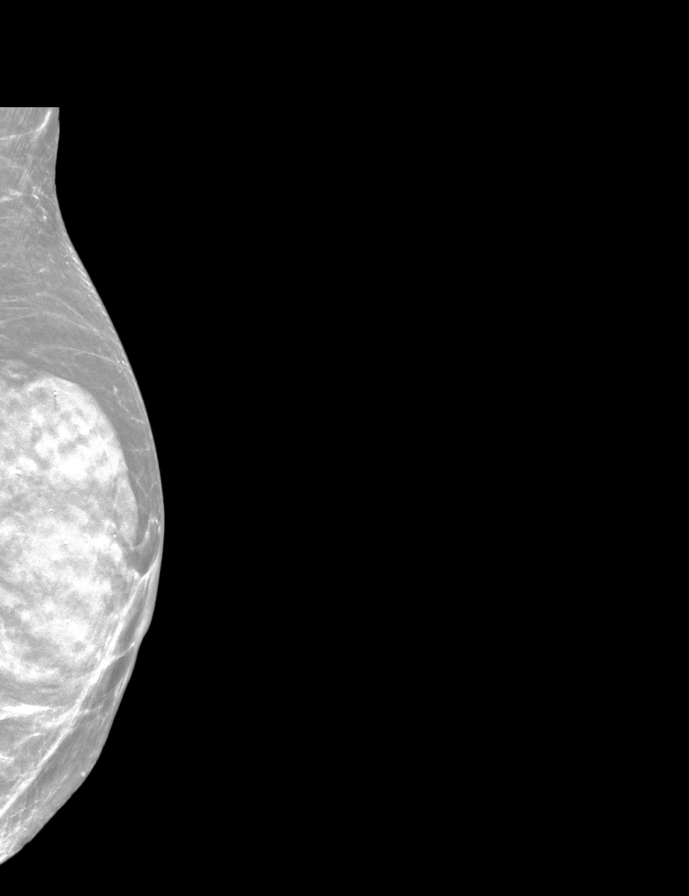

[R MLO synth-2D]
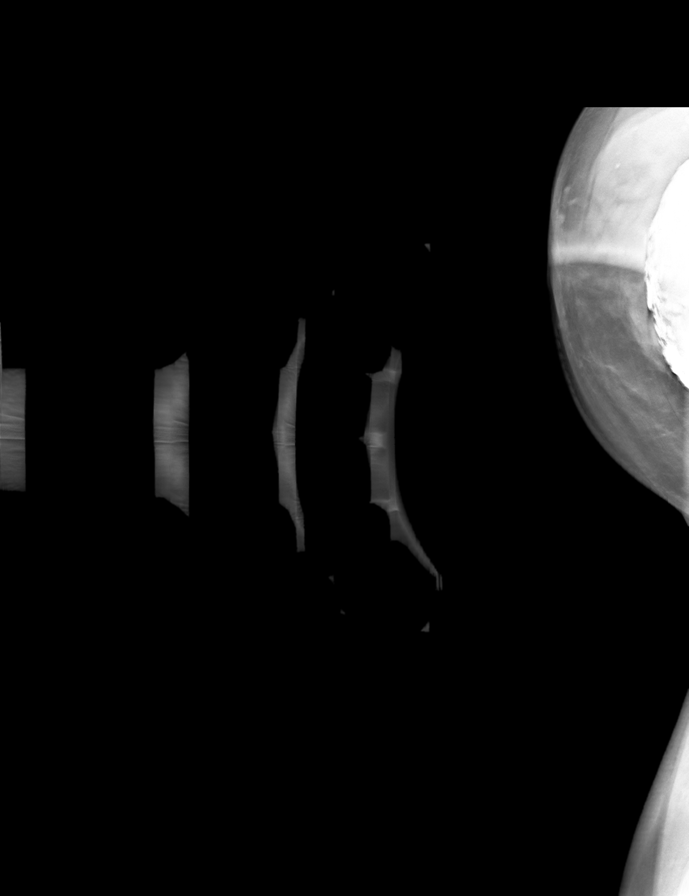

[L MLO synth-2D (2 of 2)]
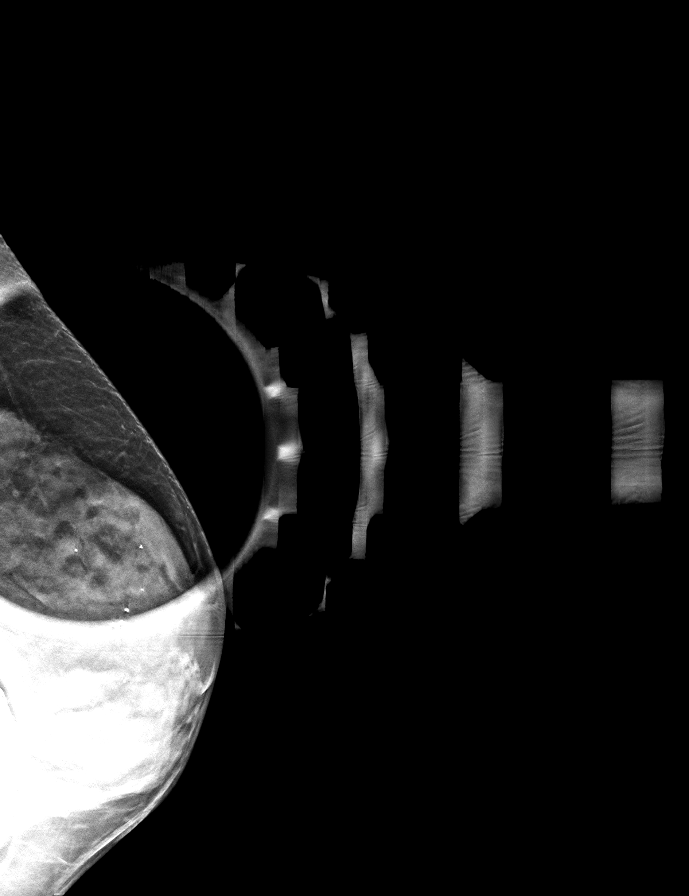

[L MLOID BREAST TOMOSYNTHESIS IMAGE tomo · tomo slice 19/37.0]
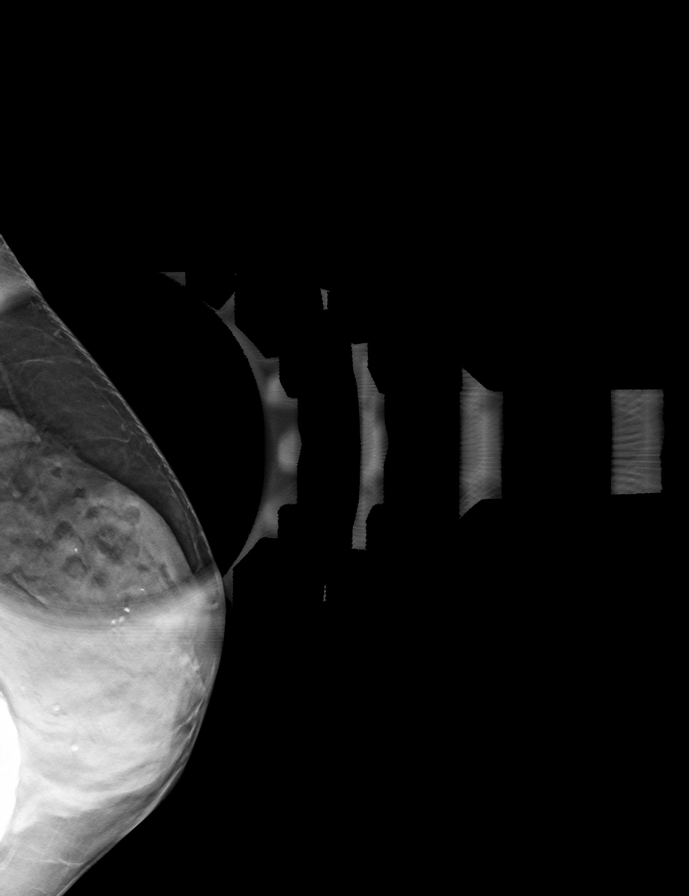

[8 of 32 positions shown; findings below may reference images not displayed]

ACR Breast Density Category d: The breast tissue is extremely dense,
which lowers the sensitivity of mammography.
FINDINGS: Mammogram:

Right breast: Full field MLO and mL views of the right breast were
performed. Attempts were made at spot compression tomosynthesis
views of the right breast which could not be obtained due to lack of
sufficient breast tissue over the patient's implant and location of
the asymmetries. Dense asymmetries persist on the full field views
in the superior and inferior aspects of the breast likely related to
silicone rupture.

Left breast: Full field tomosynthesis and spot compression
tomosynthesis views of the left breast were performed. Repeat views
were performed with mole markers demonstrating that the questioned
mass on screening mammogram is consistent with a mole.

Ultrasound:

Targeted ultrasound is performed throughout the superior and
inferior aspects of the right breast demonstrating diffuse areas of
dirty shadowing and masses with dirty shadowing concerning for
extracapsular silicone implant rupture. No definite suspicious mass
identified.
IMPRESSION: 1. Multiple areas of dirty shadowing sonographically in the right
breast inferiorly and superiorly concerning for extracapsular
silicone rupture.

2. The questioned mass on screening mammogram in the left breast is
consistent with a mole.

RECOMMENDATION:
1.  Consider surgical referral for implant replacement.

2. Diagnostic right breast mammogram in 6 months to demonstrate
stability of the multiple asymmetries in the right breast thought to
represent extracapsular silicone.

I have discussed the findings and recommendations with the patient.
If applicable, a reminder letter will be sent to the patient
regarding the next appointment.

BI-RADS CATEGORY  3: Probably benign.

## 2023-04-06 ENCOUNTER — Other Ambulatory Visit: Payer: Self-pay | Admitting: Family Medicine

## 2023-04-06 ENCOUNTER — Ambulatory Visit
Admission: RE | Admit: 2023-04-06 | Discharge: 2023-04-06 | Disposition: A | Discharge status: Home / Self Care | Payer: Medicare Other | Source: Ambulatory Visit | Attending: Family Medicine | Admitting: Family Medicine

## 2023-04-06 DIAGNOSIS — N6489 Other specified disorders of breast: Secondary | ICD-10-CM | POA: Diagnosis not present

## 2023-04-06 DIAGNOSIS — Z1231 Encounter for screening mammogram for malignant neoplasm of breast: Secondary | ICD-10-CM | POA: Diagnosis not present

## 2023-04-06 DIAGNOSIS — Z79899 Other long term (current) drug therapy: Secondary | ICD-10-CM | POA: Diagnosis not present

## 2023-05-09 DIAGNOSIS — Z79899 Other long term (current) drug therapy: Secondary | ICD-10-CM | POA: Diagnosis not present

## 2023-05-26 DIAGNOSIS — Z79899 Other long term (current) drug therapy: Secondary | ICD-10-CM | POA: Diagnosis not present

## 2023-05-26 DIAGNOSIS — I700 Atherosclerosis of aorta: Secondary | ICD-10-CM | POA: Diagnosis not present

## 2023-05-26 DIAGNOSIS — D329 Benign neoplasm of meninges, unspecified: Secondary | ICD-10-CM | POA: Diagnosis not present

## 2023-05-26 DIAGNOSIS — E039 Hypothyroidism, unspecified: Secondary | ICD-10-CM | POA: Diagnosis not present

## 2023-06-06 DIAGNOSIS — Z79899 Other long term (current) drug therapy: Secondary | ICD-10-CM | POA: Diagnosis not present

## 2023-06-30 ENCOUNTER — Encounter: Payer: Self-pay | Admitting: Family Medicine

## 2023-07-06 DIAGNOSIS — Z79899 Other long term (current) drug therapy: Secondary | ICD-10-CM | POA: Diagnosis not present

## 2023-07-17 ENCOUNTER — Ambulatory Visit (INDEPENDENT_AMBULATORY_CARE_PROVIDER_SITE_OTHER): Payer: Medicare Other | Admitting: Podiatry

## 2023-07-17 ENCOUNTER — Encounter: Payer: Self-pay | Admitting: Podiatry

## 2023-07-17 DIAGNOSIS — M79674 Pain in right toe(s): Secondary | ICD-10-CM | POA: Diagnosis not present

## 2023-07-17 DIAGNOSIS — M79675 Pain in left toe(s): Secondary | ICD-10-CM

## 2023-07-17 DIAGNOSIS — B351 Tinea unguium: Secondary | ICD-10-CM

## 2023-07-17 NOTE — Progress Notes (Signed)
This patient presents to the office with chief complaint of long thick painful nails.  Patient says the nails are painful walking and wearing shoes.  This patient is unable to self treat.  This patient is unable to trim her nails since she is unable to reach her nails.  She presents to the office for preventative foot care services.  General Appearance  Alert, conversant and in no acute stress.  Vascular  Dorsalis pedis and posterior tibial  pulses are palpable  bilaterally.  Capillary return is within normal limits  bilaterally. Temperature is within normal limits  bilaterally.  Neurologic  Senn-Weinstein monofilament wire test within normal limits  bilaterally. Muscle power within normal limits bilaterally.  Nails Thick disfigured discolored nails with subungual debris  from hallux to fifth toes bilaterally. No evidence of bacterial infection or drainage bilaterally.  Orthopedic  No limitations of motion  feet .  No crepitus or effusions noted.  No bony pathology or digital deformities noted.  Skin  normotropic skin with no porokeratosis noted bilaterally.  No signs of infections or ulcers noted.     Onychomycosis  Nails  B/L.  Pain in right toes  Pain in left toes  Debridement of nails both feet followed trimming the nails with dremel tool.  Patient is wearing nail polish so minimal dremel tool usage.  RTC 4 months.   Helane Gunther DPM

## 2023-07-25 DIAGNOSIS — K5900 Constipation, unspecified: Secondary | ICD-10-CM | POA: Diagnosis not present

## 2023-08-08 DIAGNOSIS — Z79899 Other long term (current) drug therapy: Secondary | ICD-10-CM | POA: Diagnosis not present

## 2023-09-05 DIAGNOSIS — Z79899 Other long term (current) drug therapy: Secondary | ICD-10-CM | POA: Diagnosis not present

## 2023-10-10 DIAGNOSIS — Z79899 Other long term (current) drug therapy: Secondary | ICD-10-CM | POA: Diagnosis not present

## 2023-10-18 ENCOUNTER — Ambulatory Visit (INDEPENDENT_AMBULATORY_CARE_PROVIDER_SITE_OTHER): Payer: Medicare Other | Admitting: Podiatry

## 2023-10-18 ENCOUNTER — Ambulatory Visit: Payer: Medicare Other | Admitting: Podiatry

## 2023-10-18 ENCOUNTER — Encounter: Payer: Self-pay | Admitting: Podiatry

## 2023-10-18 ENCOUNTER — Ambulatory Visit (INDEPENDENT_AMBULATORY_CARE_PROVIDER_SITE_OTHER): Payer: Medicare Other

## 2023-10-18 DIAGNOSIS — B351 Tinea unguium: Secondary | ICD-10-CM

## 2023-10-18 DIAGNOSIS — M79672 Pain in left foot: Secondary | ICD-10-CM

## 2023-10-18 DIAGNOSIS — M79675 Pain in left toe(s): Secondary | ICD-10-CM

## 2023-10-18 DIAGNOSIS — M79674 Pain in right toe(s): Secondary | ICD-10-CM

## 2023-10-18 NOTE — Progress Notes (Signed)
This patient presents to the office with chief complaint of long thick painful nails.  Patient says the nails are painful walking and wearing shoes.  This patient is unable to self treat.  This patient is unable to trim her nails since she is unable to reach her nails.   She says she injured her left heel 2 days ago and requests an xray. She presents to the office for preventative foot care services.  General Appearance  Alert, conversant and in no acute stress.  Vascular  Dorsalis pedis and posterior tibial  pulses are palpable  bilaterally.  Capillary return is within normal limits  bilaterally. Temperature is within normal limits  bilaterally.  Neurologic  Senn-Weinstein monofilament wire test within normal limits  bilaterally. Muscle power within normal limits bilaterally.  Nails Thick disfigured discolored nails with subungual debris  from hallux to fifth toes bilaterally. No evidence of bacterial infection or drainage bilaterally.  Orthopedic  No limitations of motion  feet .  No crepitus or effusions noted.  No bony pathology or digital deformities noted. No redness or swelling or palpable pain left heel.  Skin  normotropic skin with no porokeratosis noted bilaterally.  No signs of infections or ulcers noted.     Onychomycosis  Nails  B/L.  Pain in right toes  Pain in left toes  Contusion left heel.  Debridement of nails both feet followed trimming the nails with dremel tool.  Patient had xray taken revealing no bony pathology just spurring at left heel plantarly and retrocalcaneally.    RTC 3  months.   Helane Gunther DPM

## 2023-11-09 DIAGNOSIS — Z79899 Other long term (current) drug therapy: Secondary | ICD-10-CM | POA: Diagnosis not present

## 2023-12-13 DIAGNOSIS — Z79899 Other long term (current) drug therapy: Secondary | ICD-10-CM | POA: Diagnosis not present

## 2023-12-29 DIAGNOSIS — Z03818 Encounter for observation for suspected exposure to other biological agents ruled out: Secondary | ICD-10-CM | POA: Diagnosis not present

## 2023-12-29 DIAGNOSIS — J209 Acute bronchitis, unspecified: Secondary | ICD-10-CM | POA: Diagnosis not present

## 2024-01-02 DIAGNOSIS — E039 Hypothyroidism, unspecified: Secondary | ICD-10-CM | POA: Diagnosis not present

## 2024-01-02 DIAGNOSIS — Z79899 Other long term (current) drug therapy: Secondary | ICD-10-CM | POA: Diagnosis not present

## 2024-01-02 DIAGNOSIS — E7800 Pure hypercholesterolemia, unspecified: Secondary | ICD-10-CM | POA: Diagnosis not present

## 2024-01-02 DIAGNOSIS — R7301 Impaired fasting glucose: Secondary | ICD-10-CM | POA: Diagnosis not present

## 2024-01-03 DIAGNOSIS — D329 Benign neoplasm of meninges, unspecified: Secondary | ICD-10-CM | POA: Diagnosis not present

## 2024-01-03 DIAGNOSIS — E7800 Pure hypercholesterolemia, unspecified: Secondary | ICD-10-CM | POA: Diagnosis not present

## 2024-01-03 DIAGNOSIS — E039 Hypothyroidism, unspecified: Secondary | ICD-10-CM | POA: Diagnosis not present

## 2024-01-03 DIAGNOSIS — Z0001 Encounter for general adult medical examination with abnormal findings: Secondary | ICD-10-CM | POA: Diagnosis not present

## 2024-01-03 DIAGNOSIS — H9193 Unspecified hearing loss, bilateral: Secondary | ICD-10-CM | POA: Diagnosis not present

## 2024-01-04 ENCOUNTER — Other Ambulatory Visit: Payer: Self-pay | Admitting: Family Medicine

## 2024-01-04 DIAGNOSIS — D329 Benign neoplasm of meninges, unspecified: Secondary | ICD-10-CM

## 2024-01-09 DIAGNOSIS — Z79899 Other long term (current) drug therapy: Secondary | ICD-10-CM | POA: Diagnosis not present

## 2024-01-16 DIAGNOSIS — H6123 Impacted cerumen, bilateral: Secondary | ICD-10-CM | POA: Diagnosis not present

## 2024-01-18 ENCOUNTER — Ambulatory Visit: Payer: Medicare Other | Admitting: Podiatry

## 2024-01-19 ENCOUNTER — Ambulatory Visit: Payer: Medicare Other | Admitting: Podiatry

## 2024-01-22 ENCOUNTER — Ambulatory Visit (INDEPENDENT_AMBULATORY_CARE_PROVIDER_SITE_OTHER): Payer: Medicare Other | Admitting: Podiatry

## 2024-01-22 ENCOUNTER — Encounter: Payer: Self-pay | Admitting: Podiatry

## 2024-01-22 DIAGNOSIS — M79675 Pain in left toe(s): Secondary | ICD-10-CM | POA: Diagnosis not present

## 2024-01-22 DIAGNOSIS — B351 Tinea unguium: Secondary | ICD-10-CM | POA: Diagnosis not present

## 2024-01-22 DIAGNOSIS — M79674 Pain in right toe(s): Secondary | ICD-10-CM | POA: Diagnosis not present

## 2024-01-22 NOTE — Progress Notes (Signed)

## 2024-01-23 DIAGNOSIS — K5900 Constipation, unspecified: Secondary | ICD-10-CM | POA: Diagnosis not present

## 2024-01-23 DIAGNOSIS — R1900 Intra-abdominal and pelvic swelling, mass and lump, unspecified site: Secondary | ICD-10-CM | POA: Diagnosis not present

## 2024-01-29 DIAGNOSIS — R1900 Intra-abdominal and pelvic swelling, mass and lump, unspecified site: Secondary | ICD-10-CM | POA: Diagnosis not present

## 2024-01-30 ENCOUNTER — Other Ambulatory Visit: Payer: Self-pay | Admitting: Family Medicine

## 2024-01-30 ENCOUNTER — Encounter: Payer: Self-pay | Admitting: Gastroenterology

## 2024-01-30 DIAGNOSIS — N6489 Other specified disorders of breast: Secondary | ICD-10-CM

## 2024-02-06 DIAGNOSIS — Z79899 Other long term (current) drug therapy: Secondary | ICD-10-CM | POA: Diagnosis not present

## 2024-02-16 ENCOUNTER — Inpatient Hospital Stay: Admission: RE | Admit: 2024-02-16 | Payer: Medicare Other | Source: Ambulatory Visit

## 2024-02-27 DIAGNOSIS — D485 Neoplasm of uncertain behavior of skin: Secondary | ICD-10-CM | POA: Diagnosis not present

## 2024-02-27 DIAGNOSIS — C44622 Squamous cell carcinoma of skin of right upper limb, including shoulder: Secondary | ICD-10-CM | POA: Diagnosis not present

## 2024-02-27 DIAGNOSIS — D225 Melanocytic nevi of trunk: Secondary | ICD-10-CM | POA: Diagnosis not present

## 2024-03-01 ENCOUNTER — Ambulatory Visit
Admission: RE | Admit: 2024-03-01 | Discharge: 2024-03-01 | Disposition: A | Discharge status: Home / Self Care | Source: Ambulatory Visit | Attending: Family Medicine | Admitting: Family Medicine

## 2024-03-01 DIAGNOSIS — D329 Benign neoplasm of meninges, unspecified: Secondary | ICD-10-CM

## 2024-03-05 DIAGNOSIS — Z79899 Other long term (current) drug therapy: Secondary | ICD-10-CM | POA: Diagnosis not present

## 2024-03-13 DIAGNOSIS — R9389 Abnormal findings on diagnostic imaging of other specified body structures: Secondary | ICD-10-CM | POA: Diagnosis not present

## 2024-03-14 ENCOUNTER — Ambulatory Visit: Payer: Medicare Other | Admitting: Gastroenterology

## 2024-03-14 ENCOUNTER — Encounter: Payer: Self-pay | Admitting: Gastroenterology

## 2024-03-14 VITALS — BP 110/60 | HR 77 | Ht 64.0 in | Wt 134.0 lb

## 2024-03-14 DIAGNOSIS — K5909 Other constipation: Secondary | ICD-10-CM | POA: Diagnosis not present

## 2024-03-14 NOTE — Patient Instructions (Addendum)
 Please follow up as needed if you are having any symptoms. _______________________________________________________  If your blood pressure at your visit was 140/90 or greater, please contact your primary care physician to follow up on this.  _______________________________________________________  If you are age 68 or older, your body mass index should be between 23-30. Your Body mass index is 23 kg/m. If this is out of the aforementioned range listed, please consider follow up with your Primary Care Provider.  If you are age 61 or younger, your body mass index should be between 19-25. Your Body mass index is 23 kg/m. If this is out of the aformentioned range listed, please consider follow up with your Primary Care Provider.   ________________________________________________________  The [facility] providers would like to encourage you to use [facility] to communicate with providers for non-urgent requests or questions.  Due to long hold times on the telephone, sending your provider a message by [facility] may be a faster and more efficient way to get a response.  Please allow 48 business hours for a response.  Please remember that this is for non-urgent requests.  _______________________________________________________  Thank you for trusting me with your gastrointestinal care!   Boone Master, PA-C

## 2024-03-14 NOTE — Progress Notes (Signed)
 Chief Complaint: Constipation Primary GI MD: Gentry Fitz  HPI: Discussed the use of AI scribe software for clinical note transcription with the patient, who gave verbal consent to proceed.  History of Present Illness Katelyn Ryan is a 68 year old female who presents with chronic constipation. She is accompanied by Luther Parody, a worker at the group home.  She has been experiencing chronic constipation for an unspecified duration. She uses Metamucil regularly, which effectively resolves her constipation without side effects. She is open to taking it both in the morning and at night if necessary. She also has a prescription for Miralax, which was previously ineffective. She recalls having a colonoscopy within the last five years, possibly at [facility] in [place], but does not remember the specific details or findings such as the presence of polyps or the recommended follow-up interval. No family history of colon cancer is known.  Overall doing well since her OB/GYN recommended fiber supplementation and has no issues today.  She does have an upcoming back surgery for scoliosis   Past Medical History:  Diagnosis Date   Depression    Hyperlipidemia    Thyroid disease     Past Surgical History:  Procedure Laterality Date   AUGMENTATION MAMMAPLASTY Bilateral    68 years old    Current Outpatient Medications  Medication Sig Dispense Refill   acetaminophen (TYLENOL) 325 MG tablet Take 325 mg by mouth every 4 (four) hours as needed.     ASPIRIN LOW DOSE 81 MG tablet Take 81 mg by mouth daily.     B Complex Vitamins (VITAMIN B COMPLEX) CAPS TAKE ONE CAPSULE BY MOUTH EVERY DAY. for 90     B Complex-C-Folic Acid (B-COMPLEX/VITAMIN C PO) Take 1 capsule by mouth daily.     Calcium Carb-Cholecalciferol 600-5 MG-MCG TABS Take 1 tablet by mouth daily.     Calcium Citrate-Vitamin D 315-5 MG-MCG TABS Take 2 tablets by mouth 2 (two) times daily.     clonazePAM (KLONOPIN) 0.5 MG  tablet Take 0.5 mg by mouth 2 (two) times daily.     cloZAPine (CLOZARIL) 100 MG tablet Take 100 mg by mouth daily.     clozapine (CLOZARIL) 200 MG tablet Take 200 mg by mouth daily.     docusate sodium (COLACE) 100 MG capsule Take 100 mg by mouth 2 (two) times daily as needed.     escitalopram (LEXAPRO) 20 MG tablet Take 20 mg by mouth daily.     escitalopram (LEXAPRO) 5 MG tablet Take 5 mg by mouth every morning.     gabapentin (NEURONTIN) 300 MG capsule Take 300 mg by mouth every morning.     glycopyrrolate (ROBINUL) 1 MG tablet Take 1 mg by mouth. Take 1.5 mg in am, 1  tablet at noon     INGREZZA 40 MG capsule Take 40 mg by mouth every morning.     levothyroxine (SYNTHROID) 50 MCG tablet 1 tablet in the morning on an empty stomach Orally once a day for 90 days     LORazepam (ATIVAN) 1 MG tablet Take 1 mg by mouth daily.     melatonin 3 MG TABS tablet Take 3 mg by mouth at bedtime as needed.     Multiple Vitamins-Minerals (CENTRUM ULTRA WOMENS) TABS Take 1 tablet by mouth every morning.     OLANZapine (ZYPREXA) 15 MG tablet Take 15 mg by mouth at bedtime.     Polyethylene Glycol 3350 POWD 17 mg by Does not apply route daily  as needed. 17 mg in liquid , prn     rosuvastatin (CRESTOR) 5 MG tablet Take 5 mg by mouth daily.     SENNA PO Take by mouth daily as needed. 1-2 tablets daily as needed.     No current facility-administered medications for this visit.    Allergies as of 03/14/2024 - Review Complete 03/14/2024  Allergen Reaction Noted   Coffea arabica Rash 04/01/2018    Family History  Problem Relation Age of Onset   Breast cancer Cousin     Social History   Socioeconomic History   Marital status: Unknown    Spouse name: Not on file   Number of children: Not on file   Years of education: Not on file   Highest education level: Not on file  Occupational History   Not on file  Tobacco Use   Smoking status: Never   Smokeless tobacco: Not on file  Substance and Sexual  Activity   Alcohol use: Never   Drug use: Never   Sexual activity: Not on file  Other Topics Concern   Not on file  Social History Narrative   Not on file   Social Drivers of Health   Financial Resource Strain: Not on file  Food Insecurity: Not on file  Transportation Needs: Not on file  Physical Activity: Not on file  Stress: Not on file  Social Connections: Not on file  Intimate Partner Violence: Not on file    Review of Systems:    Constitutional: No weight loss, fever, chills, weakness or fatigue HEENT: Eyes: No change in vision               Ears, Nose, Throat:  No change in hearing or congestion Skin: No rash or itching Cardiovascular: No chest pain, chest pressure or palpitations   Respiratory: No SOB or cough Gastrointestinal: See HPI and otherwise negative Genitourinary: No dysuria or change in urinary frequency Neurological: No headache, dizziness or syncope Musculoskeletal: No new muscle or joint pain Hematologic: No bleeding or bruising Psychiatric: No history of depression or anxiety    Physical Exam:  Vital signs: BP 110/60   Pulse 77   Ht 5\' 4"  (1.626 m)   Wt 134 lb (60.8 kg)   BMI 23.00 kg/m   Constitutional: NAD, Well developed, Well nourished, alert and cooperative Head:  Normocephalic and atraumatic. Eyes:   PEERL, EOMI. No icterus. Conjunctiva pink. Respiratory: Respirations even and unlabored. Lungs clear to auscultation bilaterally.   No wheezes, crackles, or rhonchi.  Cardiovascular:  Regular rate and rhythm. No peripheral edema, cyanosis or pallor.  Gastrointestinal:  Soft, nondistended, nontender. No rebound or guarding. Normal bowel sounds. No appreciable masses or hepatomegaly. Rectal:  Not performed.  Msk:  Symmetrical without gross deformities. Without edema, no deformity or joint abnormality.  Neurologic:  Alert and  oriented x4;  grossly normal neurologically.  Skin:   Dry and intact without significant lesions or  rashes. Psychiatric: Oriented to person, place and time. Demonstrates good judgement and reason without abnormal affect or behaviors.   RELEVANT LABS AND IMAGING: CBC    Component Value Date/Time   WBC 8.6 12/19/2022 1315   RBC 4.39 12/19/2022 1315   HGB 13.2 12/19/2022 1315   HCT 41.7 12/19/2022 1315   PLT 187 12/19/2022 1315   MCV 95.0 12/19/2022 1315   MCH 30.1 12/19/2022 1315   MCHC 31.7 12/19/2022 1315   RDW 13.4 12/19/2022 1315   LYMPHSABS 0.7 12/19/2022 1315   MONOABS 0.9 12/19/2022  1315   EOSABS 0.0 12/19/2022 1315   BASOSABS 0.0 12/19/2022 1315    CMP     Component Value Date/Time   NA 140 12/19/2022 1315   K 3.7 12/19/2022 1315   CL 101 12/19/2022 1315   CO2 28 12/19/2022 1315   GLUCOSE 99 12/19/2022 1315   BUN 12 12/19/2022 1315   CREATININE 0.80 12/19/2022 1315   CALCIUM 9.1 12/19/2022 1315   PROT 7.0 12/19/2022 1315   ALBUMIN 4.0 12/19/2022 1315   AST 28 12/19/2022 1315   ALT 20 12/19/2022 1315   ALKPHOS 79 12/19/2022 1315   BILITOT 0.4 12/19/2022 1315   GFRNONAA >60 12/19/2022 1315     Assessment/Plan:   Assessment & Plan  Chronic constipation Chronic constipation managed with Metamucil. No family history of colon cancer or medications causing constipation.  Discussed possibility of opioid-induced constipation after her upcoming back surgery and would recommend increasing her bowel regimen if constipation becomes a problem again - Continue Metamucil daily. - Obtain last colonoscopy records to determine next due date. - Advise to call if constipation worsens or fiber becomes ineffective. - Consider Miralax if fiber becomes ineffective, especially with post-surgery narcotics.  [place] Upcoming back surgery  Assigned to Dr. Tomasa Rand today  Boone Master, PA-C [facility] 03/14/2024, 3:39 PM  Cc: Darrow Bussing, MD

## 2024-03-17 NOTE — Progress Notes (Signed)
 Agree with the assessment and plan as outlined by Boone Master, PA-C.

## 2024-03-18 ENCOUNTER — Inpatient Hospital Stay: Admission: RE | Admit: 2024-03-18 | Source: Ambulatory Visit

## 2024-03-18 ENCOUNTER — Other Ambulatory Visit: Payer: Self-pay | Admitting: Family Medicine

## 2024-03-18 DIAGNOSIS — D329 Benign neoplasm of meninges, unspecified: Secondary | ICD-10-CM

## 2024-03-21 DIAGNOSIS — H6122 Impacted cerumen, left ear: Secondary | ICD-10-CM | POA: Diagnosis not present

## 2024-03-26 DIAGNOSIS — H5203 Hypermetropia, bilateral: Secondary | ICD-10-CM | POA: Diagnosis not present

## 2024-03-26 DIAGNOSIS — H40033 Anatomical narrow angle, bilateral: Secondary | ICD-10-CM | POA: Diagnosis not present

## 2024-04-02 DIAGNOSIS — Z85828 Personal history of other malignant neoplasm of skin: Secondary | ICD-10-CM | POA: Diagnosis not present

## 2024-04-02 DIAGNOSIS — Z08 Encounter for follow-up examination after completed treatment for malignant neoplasm: Secondary | ICD-10-CM | POA: Diagnosis not present

## 2024-04-02 DIAGNOSIS — Z79899 Other long term (current) drug therapy: Secondary | ICD-10-CM | POA: Diagnosis not present

## 2024-04-03 DIAGNOSIS — H268 Other specified cataract: Secondary | ICD-10-CM | POA: Diagnosis not present

## 2024-04-03 DIAGNOSIS — Z01818 Encounter for other preprocedural examination: Secondary | ICD-10-CM | POA: Diagnosis not present

## 2024-04-09 ENCOUNTER — Ambulatory Visit
Admission: RE | Admit: 2024-04-09 | Discharge: 2024-04-09 | Disposition: A | Discharge status: Home / Self Care | Source: Ambulatory Visit | Attending: Family Medicine | Admitting: Family Medicine

## 2024-04-09 ENCOUNTER — Ambulatory Visit
Admission: RE | Admit: 2024-04-09 | Discharge: 2024-04-09 | Disposition: A | Discharge status: Home / Self Care | Payer: Medicare Other | Source: Ambulatory Visit | Attending: Family Medicine | Admitting: Family Medicine

## 2024-04-09 ENCOUNTER — Other Ambulatory Visit: Payer: Self-pay | Admitting: Family Medicine

## 2024-04-09 DIAGNOSIS — N6489 Other specified disorders of breast: Secondary | ICD-10-CM

## 2024-04-09 DIAGNOSIS — R928 Other abnormal and inconclusive findings on diagnostic imaging of breast: Secondary | ICD-10-CM | POA: Diagnosis not present

## 2024-04-09 DIAGNOSIS — N6452 Nipple discharge: Secondary | ICD-10-CM

## 2024-04-22 ENCOUNTER — Ambulatory Visit (INDEPENDENT_AMBULATORY_CARE_PROVIDER_SITE_OTHER): Payer: Medicare Other | Admitting: Podiatry

## 2024-04-22 ENCOUNTER — Encounter: Payer: Self-pay | Admitting: Podiatry

## 2024-04-22 DIAGNOSIS — M79675 Pain in left toe(s): Secondary | ICD-10-CM

## 2024-04-22 DIAGNOSIS — B351 Tinea unguium: Secondary | ICD-10-CM | POA: Diagnosis not present

## 2024-04-22 DIAGNOSIS — M79674 Pain in right toe(s): Secondary | ICD-10-CM

## 2024-04-22 NOTE — Progress Notes (Signed)

## 2024-04-23 DIAGNOSIS — H903 Sensorineural hearing loss, bilateral: Secondary | ICD-10-CM | POA: Diagnosis not present

## 2024-05-07 DIAGNOSIS — Z79899 Other long term (current) drug therapy: Secondary | ICD-10-CM | POA: Diagnosis not present

## 2024-05-10 DIAGNOSIS — E039 Hypothyroidism, unspecified: Secondary | ICD-10-CM | POA: Diagnosis not present

## 2024-05-10 DIAGNOSIS — H268 Other specified cataract: Secondary | ICD-10-CM | POA: Diagnosis not present

## 2024-05-10 DIAGNOSIS — H25811 Combined forms of age-related cataract, right eye: Secondary | ICD-10-CM | POA: Diagnosis not present

## 2024-05-31 DIAGNOSIS — E039 Hypothyroidism, unspecified: Secondary | ICD-10-CM | POA: Diagnosis not present

## 2024-05-31 DIAGNOSIS — H268 Other specified cataract: Secondary | ICD-10-CM | POA: Diagnosis not present

## 2024-05-31 DIAGNOSIS — H25812 Combined forms of age-related cataract, left eye: Secondary | ICD-10-CM | POA: Diagnosis not present

## 2024-06-04 DIAGNOSIS — Z79899 Other long term (current) drug therapy: Secondary | ICD-10-CM | POA: Diagnosis not present

## 2024-07-05 DIAGNOSIS — D329 Benign neoplasm of meninges, unspecified: Secondary | ICD-10-CM | POA: Diagnosis not present

## 2024-07-05 DIAGNOSIS — R5383 Other fatigue: Secondary | ICD-10-CM | POA: Diagnosis not present

## 2024-07-05 DIAGNOSIS — Z1211 Encounter for screening for malignant neoplasm of colon: Secondary | ICD-10-CM | POA: Diagnosis not present

## 2024-07-05 DIAGNOSIS — E7800 Pure hypercholesterolemia, unspecified: Secondary | ICD-10-CM | POA: Diagnosis not present

## 2024-07-05 DIAGNOSIS — E039 Hypothyroidism, unspecified: Secondary | ICD-10-CM | POA: Diagnosis not present

## 2024-07-24 ENCOUNTER — Encounter: Payer: Self-pay | Admitting: Podiatry

## 2024-07-24 ENCOUNTER — Ambulatory Visit (INDEPENDENT_AMBULATORY_CARE_PROVIDER_SITE_OTHER): Admitting: Podiatry

## 2024-07-24 DIAGNOSIS — M79674 Pain in right toe(s): Secondary | ICD-10-CM

## 2024-07-24 DIAGNOSIS — M79675 Pain in left toe(s): Secondary | ICD-10-CM | POA: Diagnosis not present

## 2024-07-24 DIAGNOSIS — B351 Tinea unguium: Secondary | ICD-10-CM

## 2024-07-24 NOTE — Progress Notes (Signed)

## 2024-07-26 DIAGNOSIS — D72829 Elevated white blood cell count, unspecified: Secondary | ICD-10-CM | POA: Diagnosis not present

## 2024-09-05 DIAGNOSIS — Z79899 Other long term (current) drug therapy: Secondary | ICD-10-CM | POA: Diagnosis not present

## 2024-09-27 DIAGNOSIS — M25551 Pain in right hip: Secondary | ICD-10-CM | POA: Diagnosis not present

## 2024-11-25 ENCOUNTER — Ambulatory Visit: Admitting: Podiatry

## 2024-12-25 ENCOUNTER — Ambulatory Visit: Admitting: Podiatry

## 2024-12-25 ENCOUNTER — Encounter: Payer: Self-pay | Admitting: Podiatry

## 2024-12-25 DIAGNOSIS — B351 Tinea unguium: Secondary | ICD-10-CM

## 2024-12-25 DIAGNOSIS — M79674 Pain in right toe(s): Secondary | ICD-10-CM | POA: Diagnosis not present

## 2024-12-25 DIAGNOSIS — M79675 Pain in left toe(s): Secondary | ICD-10-CM

## 2024-12-25 NOTE — Progress Notes (Signed)

## 2025-04-24 ENCOUNTER — Ambulatory Visit: Admitting: Podiatry
